# Patient Record
Sex: Male | Born: 2000 | Race: White | Hispanic: No | Marital: Single | Smoking: Never smoker
Health system: Southern US, Community
[De-identification: ages and names within clinical notes are randomized; demographics above are authoritative.]

---
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
## 2022-02-05 DIAGNOSIS — M79671 Pain in right foot: Secondary | ICD-10-CM | POA: Diagnosis not present

## 2022-03-25 IMAGING — CT CT HAND*L* W/O CM
1 series · 12 of 14 positions shown, 15 images · non-contrast
Comparison: None.

CLINICAL DATA: Hand pain.  Concern for hamate fracture.

EXAM:
CT OF THE LEFT HAND WITHOUT CONTRAST
TECHNIQUE: Multidetector CT imaging of the left hand was performed according to
the standard protocol. Multiplanar CT image reconstructions were
also generated.

[Series 3: ext-soft · axial · 0.38mm/px · z∈[+36,+238]mm · 12 of 121 slices shown, 15 images]
[im 10/121  soft-tissue]
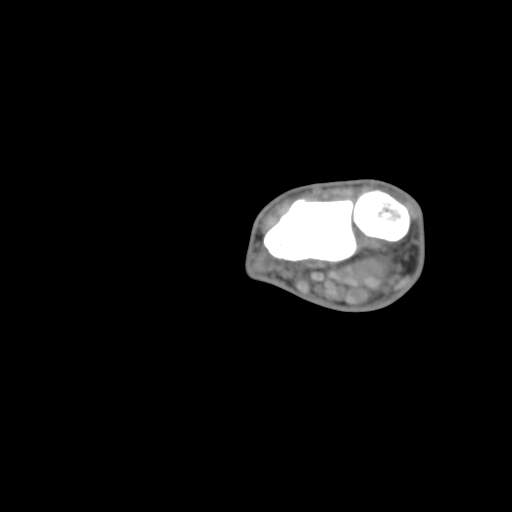
[im 10/121  bone]
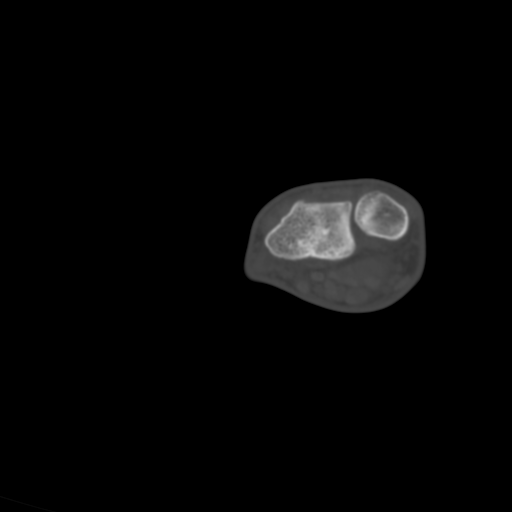
[im 19/121  bone]
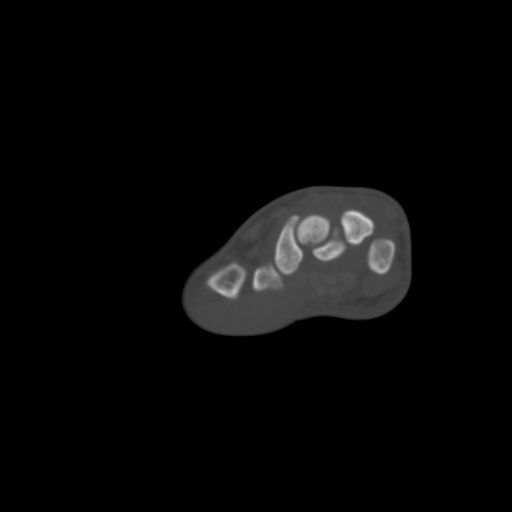
[im 28/121  bone]
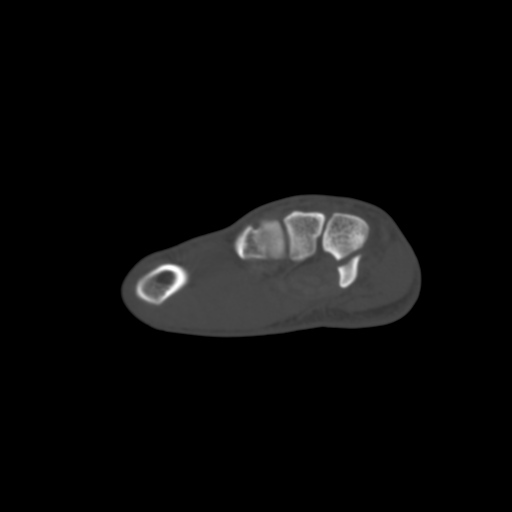
[im 37/121  bone]
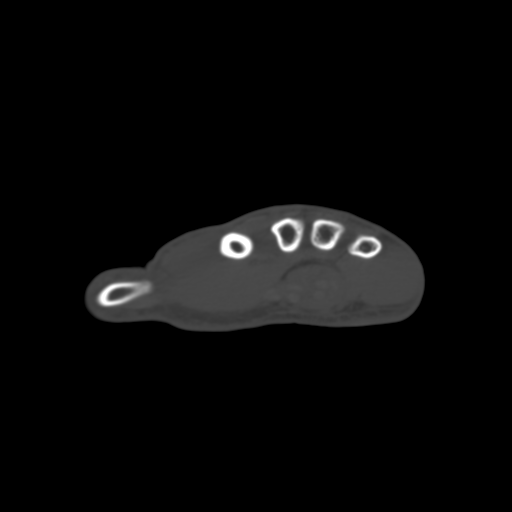
[im 47/121  soft-tissue]
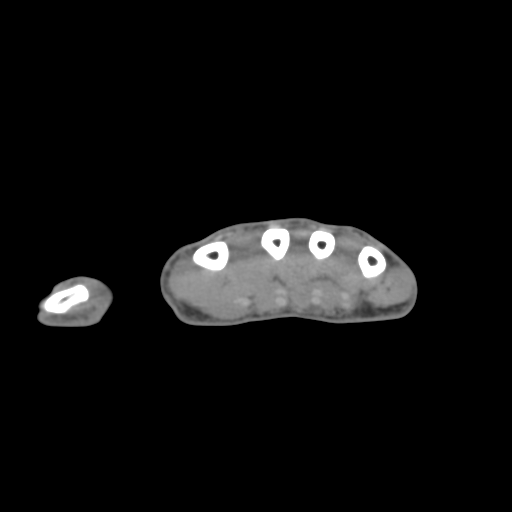
[im 47/121  bone]
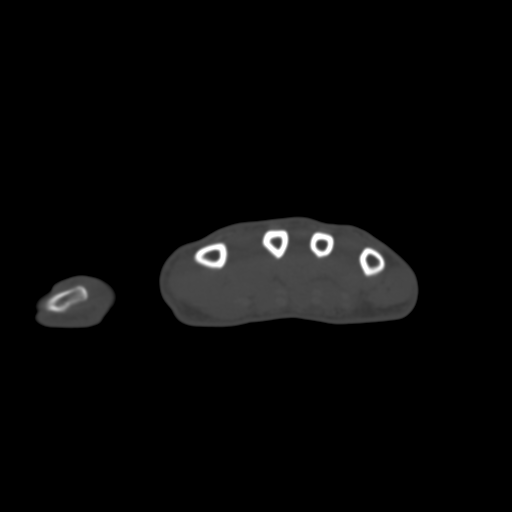
[im 56/121  bone]
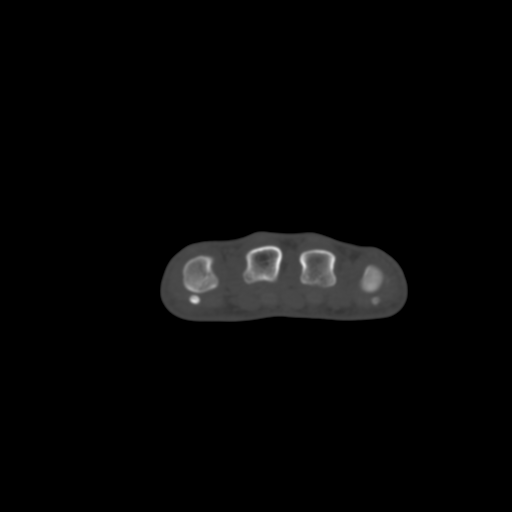
[im 65/121  bone]
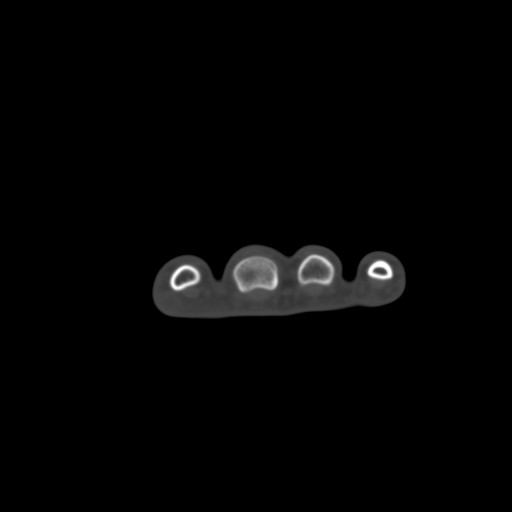
[im 74/121  bone]
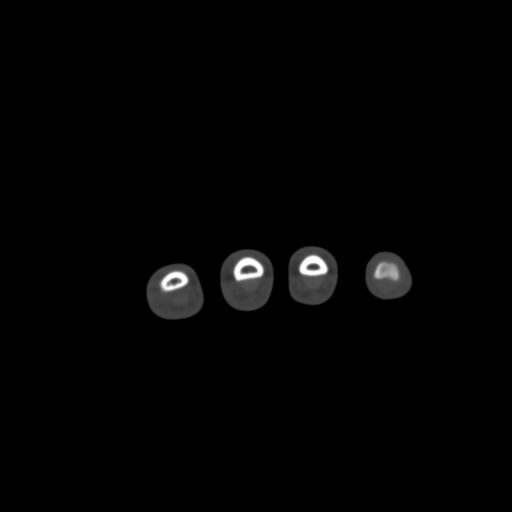
[im 84/121  soft-tissue]
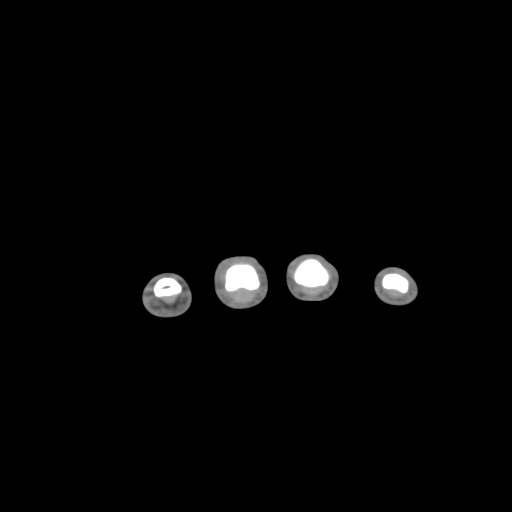
[im 84/121  bone]
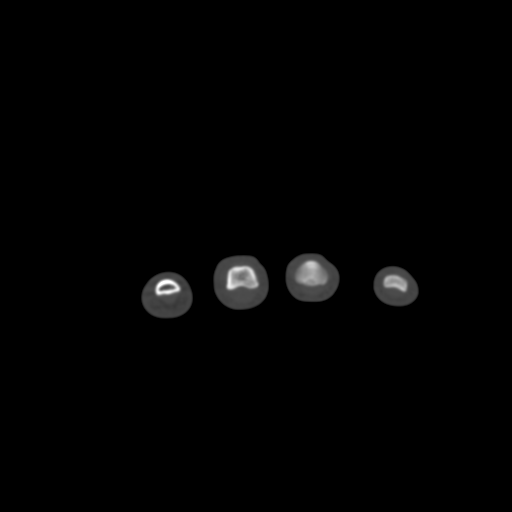
[im 93/121  bone]
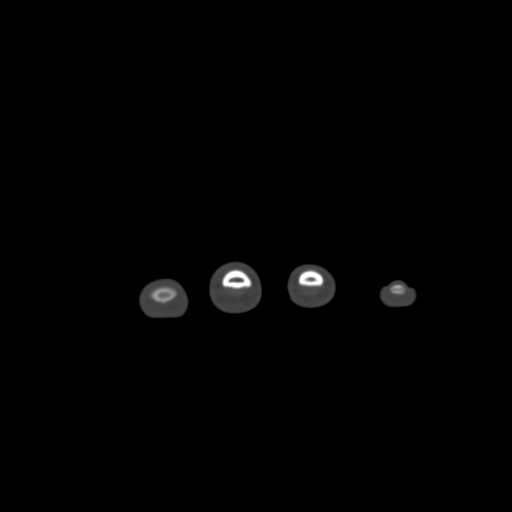
[im 102/121  bone]
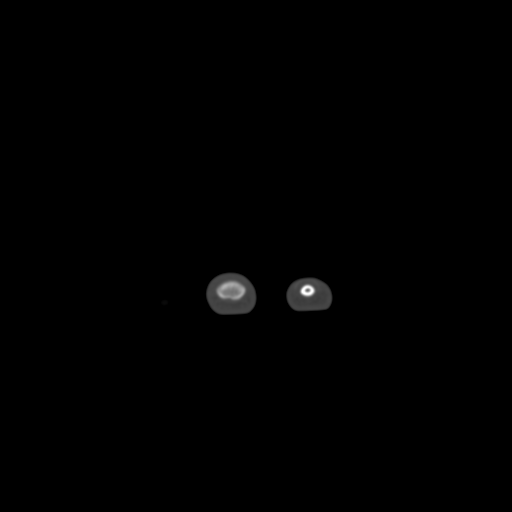
[im 111/121  bone]
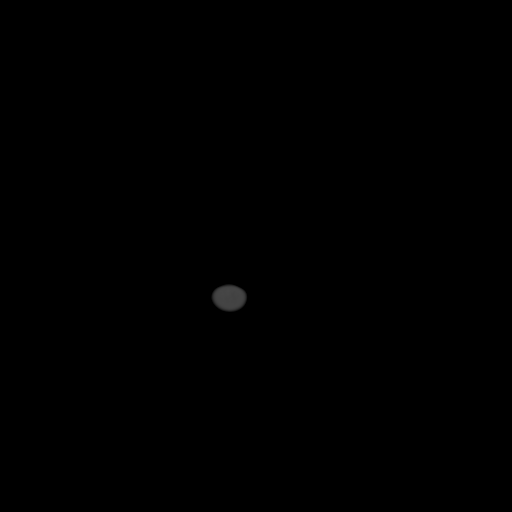

[12 of 14 positions shown; findings below may reference images not displayed]

FINDINGS: Bones/Joint/Cartilage

Acute fracture through the base of the hook of the hamate with 3 mm
of fracture distraction (series 4, image 94; series 9, image 63).
Remaining osseous structures are intact. No additional fractures.
Carpal alignment is maintained. No dislocation. Joint spaces are
maintained. No significant arthropathy.

Ligaments

Suboptimally assessed by CT.

Muscles and Tendons

No acute musculotendinous abnormality.

Soft tissues

No soft tissue hematoma.
IMPRESSION: Acute minimally displaced fracture through the base of the hook of
the hamate.
